# Patient Record
Sex: Male | Born: 1980 | Race: White | Hispanic: No | Smoking: Never smoker
Health system: Southern US, Community
[De-identification: ages and names within clinical notes are randomized; demographics above are authoritative.]

---

## 2020-01-31 ENCOUNTER — Emergency Department (HOSPITAL_BASED_OUTPATIENT_CLINIC_OR_DEPARTMENT_OTHER): Payer: Self-pay

## 2020-01-31 ENCOUNTER — Emergency Department (HOSPITAL_BASED_OUTPATIENT_CLINIC_OR_DEPARTMENT_OTHER)
Admission: EM | Admit: 2020-01-31 | Discharge: 2020-01-31 | Disposition: A | Discharge status: Home / Self Care | Payer: Self-pay | Attending: Emergency Medicine | Admitting: Emergency Medicine

## 2020-01-31 ENCOUNTER — Encounter (HOSPITAL_BASED_OUTPATIENT_CLINIC_OR_DEPARTMENT_OTHER): Payer: Self-pay

## 2020-01-31 ENCOUNTER — Other Ambulatory Visit: Payer: Self-pay

## 2020-01-31 DIAGNOSIS — M5413 Radiculopathy, cervicothoracic region: Secondary | ICD-10-CM | POA: Insufficient documentation

## 2020-01-31 DIAGNOSIS — M25512 Pain in left shoulder: Secondary | ICD-10-CM | POA: Insufficient documentation

## 2020-01-31 LAB — CBC WITH DIFFERENTIAL/PLATELET
Abs Immature Granulocytes: 0.03 10*3/uL (ref 0.00–0.07)
Basophils Absolute: 0.1 10*3/uL (ref 0.0–0.1)
Basophils Relative: 1 %
Eosinophils Absolute: 0.2 10*3/uL (ref 0.0–0.5)
Eosinophils Relative: 2 %
HCT: 47.6 % (ref 39.0–52.0)
Hemoglobin: 16.2 g/dL (ref 13.0–17.0)
Immature Granulocytes: 0 %
Lymphocytes Relative: 29 %
Lymphs Abs: 2.8 10*3/uL (ref 0.7–4.0)
MCH: 31.4 pg (ref 26.0–34.0)
MCHC: 34 g/dL (ref 30.0–36.0)
MCV: 92.2 fL (ref 80.0–100.0)
Monocytes Absolute: 0.9 10*3/uL (ref 0.1–1.0)
Monocytes Relative: 9 %
Neutro Abs: 5.9 10*3/uL (ref 1.7–7.7)
Neutrophils Relative %: 59 %
Platelets: 254 10*3/uL (ref 150–400)
RBC: 5.16 MIL/uL (ref 4.22–5.81)
RDW: 12.6 % (ref 11.5–15.5)
WBC: 10 10*3/uL (ref 4.0–10.5)
nRBC: 0 % (ref 0.0–0.2)

## 2020-01-31 LAB — BASIC METABOLIC PANEL
Anion gap: 11 (ref 5–15)
BUN: 16 mg/dL (ref 6–20)
CO2: 23 mmol/L (ref 22–32)
Calcium: 9.1 mg/dL (ref 8.9–10.3)
Chloride: 105 mmol/L (ref 98–111)
Creatinine, Ser: 0.96 mg/dL (ref 0.61–1.24)
GFR calc Af Amer: >60 mL/min (ref >60)
GFR calc non Af Amer: >60 mL/min (ref >60)
Glucose, Bld: 101 mg/dL — ABNORMAL HIGH (ref 70–99)
Potassium: 3.8 mmol/L (ref 3.5–5.1)
Sodium: 139 mmol/L (ref 135–145)

## 2020-01-31 LAB — TROPONIN I (HIGH SENSITIVITY): Troponin I (High Sensitivity): <2 ng/L (ref <18)

## 2020-01-31 MED ORDER — PREDNISONE 10 MG PO TABS: 40.0000 mg | ORAL_TABLET | Freq: Every day | ORAL | 0 refills | Status: AC

## 2020-01-31 NOTE — ED Provider Notes (Signed)
[facility] EMERGENCY DEPARTMENT Provider Note   CSN: [id number] Arrival date & time: 01/31/20  1949     History Chief Complaint  Patient presents with  . Shoulder Pain    Fred Carlson is a 39 y.o. male who presents to the ED today complaining of gradual onset, constant, worsening, left scapular pain that began 2 days ago.  She reports he woke up with pain.  He assumed he had slept wrong and went through his day without thinking of it anymore.  He states that today he noticed worsening pain as well as a tingling sensation to his left hand that has been coming and going.  Patient has not taken anything for the pain.  When asked if he is having chest pain he states "not really."  She does endorse significant family history for cardiac disease, reports his father had to have stenting placed at the age of 70.  He is unsure how old his paternal uncle was but states he thinks he may have been young as well.  Patient used to smoke occasionally as a teenager however none currently. Denies neck pain, numbness/weakness, shortness of breath, nausea, vomiting, or any other associated symptoms.   The history is provided by the patient and medical records.    HPI: A 39 year old patient with a history of obesity presents for evaluation of chest pain. Initial onset of pain was more than 6 hours ago. The patient's chest pain is not worse with exertion. The patient's chest pain is not middle- or left-sided, is not well-localized, is not described as heaviness/pressure/tightness, is not sharp and does radiate to the arms/jaw/neck. The patient does not complain of nausea and denies diaphoresis. The patient has a family history of coronary artery disease in a first-degree relative with onset less than age 54. The patient has no history of stroke, has no history of peripheral artery disease, has not smoked in the past 90 days, denies any history of treated diabetes, is not hypertensive and has no history of  hypercholesterolemia.   History reviewed. No pertinent past medical history.  There are no problems to display for this patient.   History reviewed. No pertinent surgical history.     No family history on file.  Social History   Tobacco Use  . Smoking status: Never Smoker  . Smokeless tobacco: Never Used  Substance Use Topics  . Alcohol use: Never  . Drug use: Never    Home Medications Prior to Admission medications   Medication Sig Start Date End Date Taking? Authorizing Provider  predniSONE (DELTASONE) 10 MG tablet Take 4 tablets (40 mg total) by mouth daily for 5 days. 01/31/20 02/05/20  Tanda Rockers, PA-C    Allergies    Bee venom and Levofloxacin  Review of Systems   Review of Systems  Constitutional: Negative for chills and fever.  Respiratory: Negative for shortness of breath.   Cardiovascular: Negative for chest pain.  Gastrointestinal: Negative for abdominal pain, nausea and vomiting.  Musculoskeletal: Positive for arthralgias.  Neurological: Negative for weakness and numbness.       + tingling to left hand  All other systems reviewed and are negative.   Physical Exam Updated Vital Signs BP (!) 148/99 (BP Location: Left Arm)   Pulse 78   Temp 98 F (36.7 C) (Oral)   Resp 16   Ht 6' (1.829 m)   Wt 104.3 kg   SpO2 98%   BMI 31.19 kg/m   Physical Exam Vitals and nursing note  reviewed.  Constitutional:      Appearance: He is obese. He is not ill-appearing or diaphoretic.  HENT:     Head: Normocephalic and atraumatic.  Eyes:     Conjunctiva/sclera: Conjunctivae normal.  Cardiovascular:     Rate and Rhythm: Normal rate and regular rhythm.     Pulses: Normal pulses.  Pulmonary:     Effort: Pulmonary effort is normal.     Breath sounds: Normal breath sounds. No wheezing, rhonchi or rales.  Abdominal:     Palpations: Abdomen is soft.     Tenderness: There is no abdominal tenderness. There is no guarding or rebound.  Musculoskeletal:      Cervical back: Neck supple.     Comments: No C, T, or L midline spinal tenderness. + Tenderness below left scapula. ROM intact to LUE. Strength and sensation intact throughout. 2+ radial pulse.   Skin:    General: Skin is warm and dry.  Neurological:     Mental Status: He is alert.     ED Results / Procedures / Treatments   Labs (all labs ordered are listed, but only abnormal results are displayed) Labs Reviewed  BASIC METABOLIC PANEL - Abnormal; Notable for the following components:      Result Value   Glucose, Bld 101 (*)    All other components within normal limits  CBC WITH DIFFERENTIAL/PLATELET  URINALYSIS, ROUTINE W REFLEX MICROSCOPIC  TROPONIN I (HIGH SENSITIVITY)  TROPONIN I (HIGH SENSITIVITY)    EKG EKG Interpretation  Date/Time:  Tuesday January 31 2020 20:08:45 EDT Ventricular Rate:  79 PR Interval:    QRS Duration: 84 QT Interval:  353 QTC Calculation: 405 R Axis:   69 Text Interpretation: Sinus rhythm Confirmed by Fredia Sorrow [phone]) on 01/31/2020 8:13:33 PM   Radiology DG Chest 2 View  Result Date: 01/31/2020 CLINICAL DATA:  39 year old male with shoulder pain. EXAM: CHEST - 2 VIEW COMPARISON:  None. FINDINGS: The lungs are clear. There is no pleural effusion or pneumothorax. The cardiac silhouette is within normal limits. No acute osseous pathology. IMPRESSION: No active cardiopulmonary disease. Electronically Signed   By: Anner Crete M.D.   On: 01/31/2020 20:53    Procedures Procedures (including critical care time)  Medications Ordered in ED Medications - No data to display  ED Course  I have reviewed the triage vital signs and the nursing notes.  Pertinent labs & imaging results that were available during my care of the patient were reviewed by me and considered in my medical decision making (see chart for details).  39 year old male who presents the ED today complaining of left scapular pain and paresthesias to his left hand x2 days.   Arrival to the ED patient is afebrile, nontachycardic and nontachypneic.  He appears to be in no acute distress.  Nondiaphoretic.  Asking if patient has chest pain he states "not really" however is not very straightforward with his answer. He does note significant FHx for cardiac disease. EKG without ischemic changes. Pt without any obvious left shoulder or cervical pain to account for paresthesias in hand. Negative spurling's test. Will work up from a cardiac stand point at this time given history. If no acute findings will treat for MSK type pain. PERC negative at this time. Pt has equal pulses bilaterally and blood pressure stable; doubt dissection.   CBC without leukocytosis.  Hemoglobin stable. BMP with glucose 101.  No other electrolyte abnormalities.  Troponin less than 2. CXR clear.   Given pt's  pain has been ongoing for 2 days do not feel he needs a repeat trop. Will treat with prednisone with questionable pinched nerve causing tingling. Pt advised to take tylenol with prednisone as well. Strict return precautions discussed with pt. He is in agreement with plan and stable for discharge home.   Discussed case with attending physician Dr. Deretha Emory who agrees with plan   This note was prepared using Dragon voice recognition software and may include unintentional dictation errors due to the inherent limitations of voice recognition software.    MDM Rules/Calculators/A&P HEAR Score: 1                     Final Clinical Impression(s) / ED Diagnoses Final diagnoses:  Acute pain of left shoulder  Radiculopathy of cervicothoracic region    Rx / DC Orders ED Discharge Orders         Ordered    predniSONE (DELTASONE) 10 MG tablet  Daily     01/31/20 2204           Discharge Instructions     Please pick up medication and take as prescribed. You can take Tylenol with the prednisone to help with pain. I would not recommend NSAIDs as it can increase risk of bleeding in your stomach.     Return to the ED for any worsening symptoms including worsening pain, worsening tingling, chest pain, shortness of breath, passing out.        Tanda Rockers, PA-C 01/31/20 2206    Vanetta Mulders, MD 02/03/20 [phone]

## 2020-01-31 NOTE — Discharge Instructions (Addendum)
Please pick up medication and take as prescribed. You can take Tylenol with the prednisone to help with pain. I would not recommend NSAIDs as it can increase risk of bleeding in your stomach.   Return to the ED for any worsening symptoms including worsening pain, worsening tingling, chest pain, shortness of breath, passing out.

## 2020-01-31 NOTE — ED Triage Notes (Addendum)
Pt c/o numbness to left UE started last night and "knot in my left shoulder" x 2 days-denies injury-pt points to left mid back/scapula area for pain site-NAD-steady gait

## 2020-01-31 NOTE — ED Notes (Signed)
Pt reports that during x-ray the positions he had to hold his LUE caused significant pain however states that since returning to room this is the longest he has been able to stay comfortably in the same position.  EDP made aware. Pt continues to deny any radiation of pain from scapula to chest.

## 2020-01-31 NOTE — ED Notes (Signed)
Pt aware of need for urine specimen. Urinal at bedside

## 2020-10-25 IMAGING — DX DG CHEST 2V
2 series · 2 of 2 positions shown · non-contrast
Comparison: None.

CLINICAL DATA: 38-year-old male with shoulder pain.

EXAM:
CHEST - 2 VIEW

[chest pa]
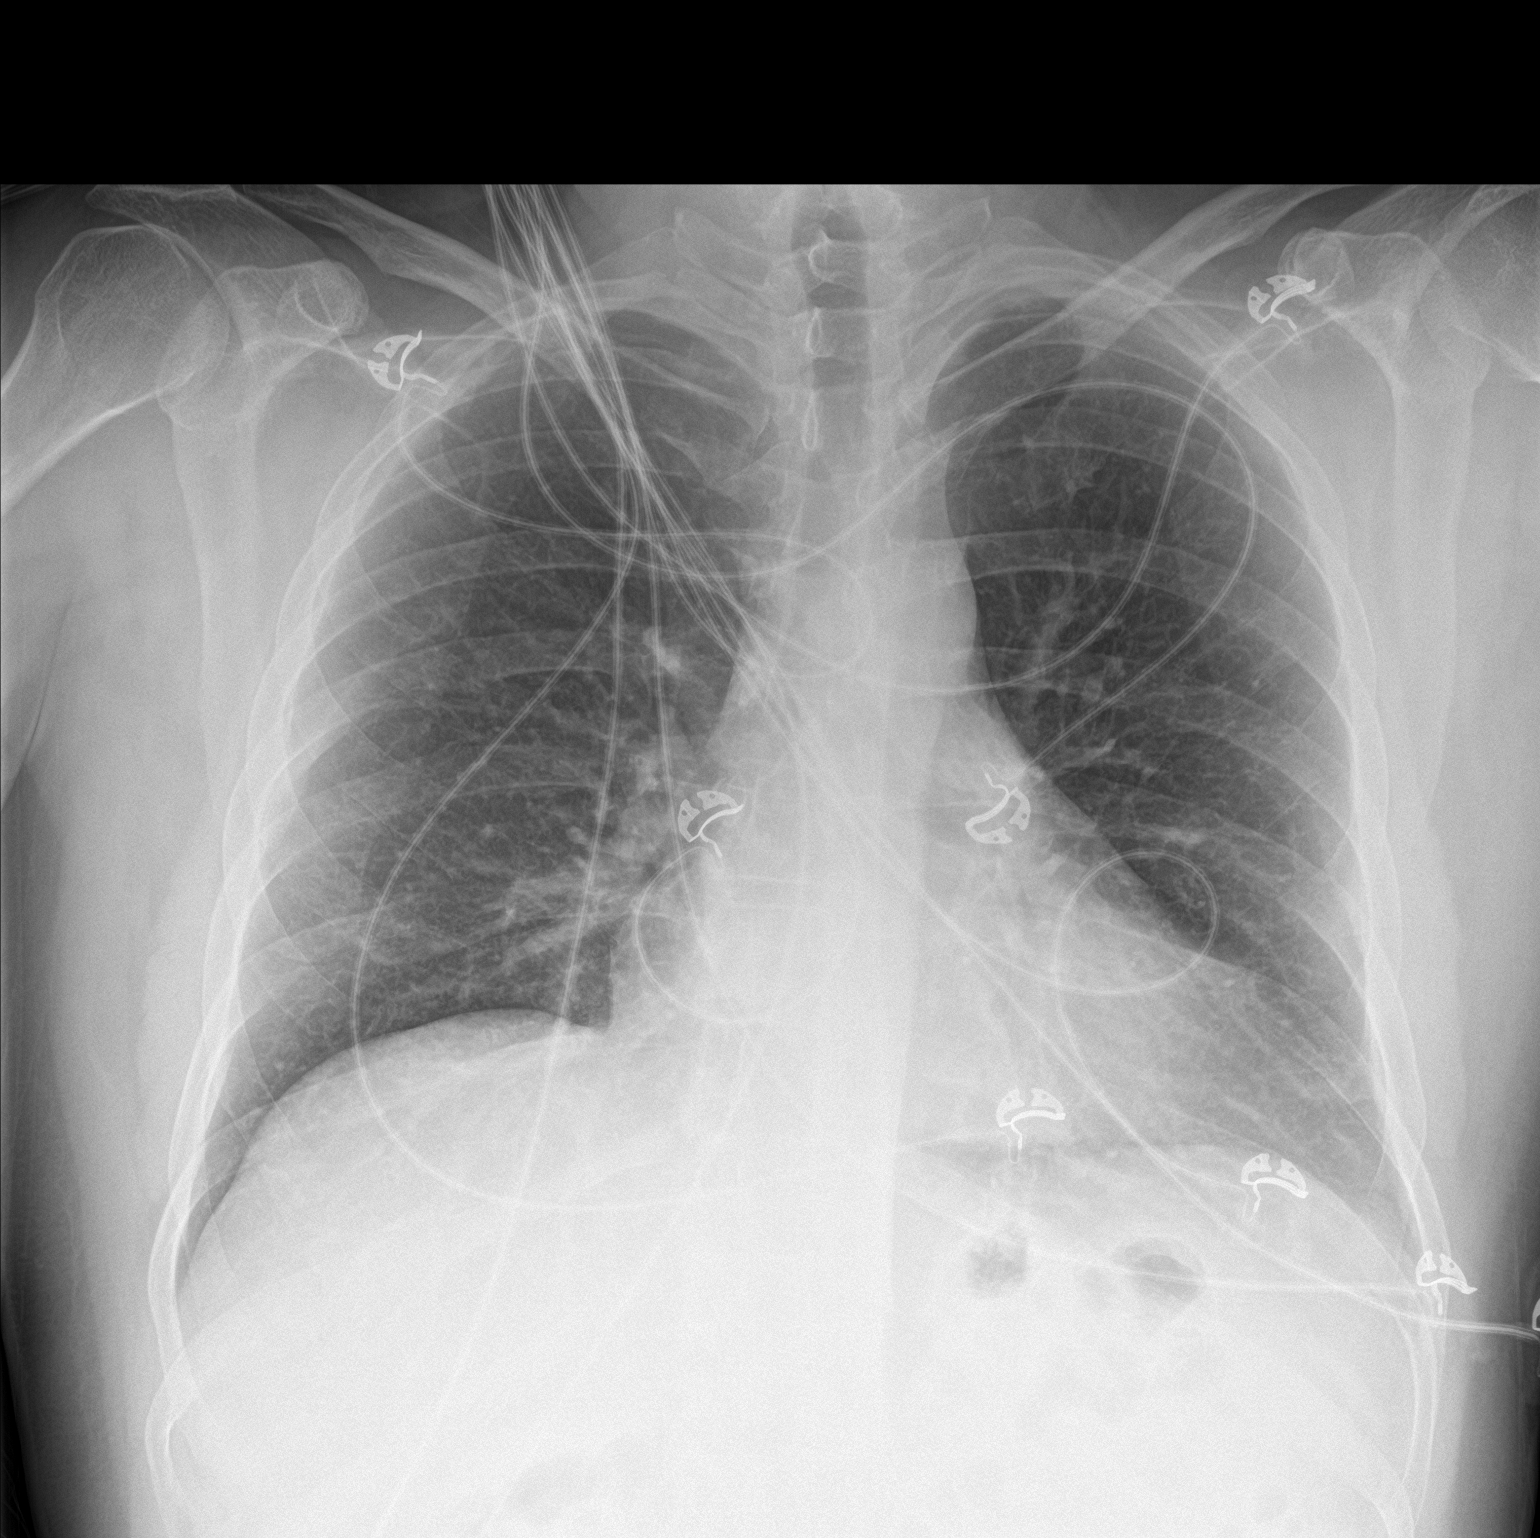

[chest lat]
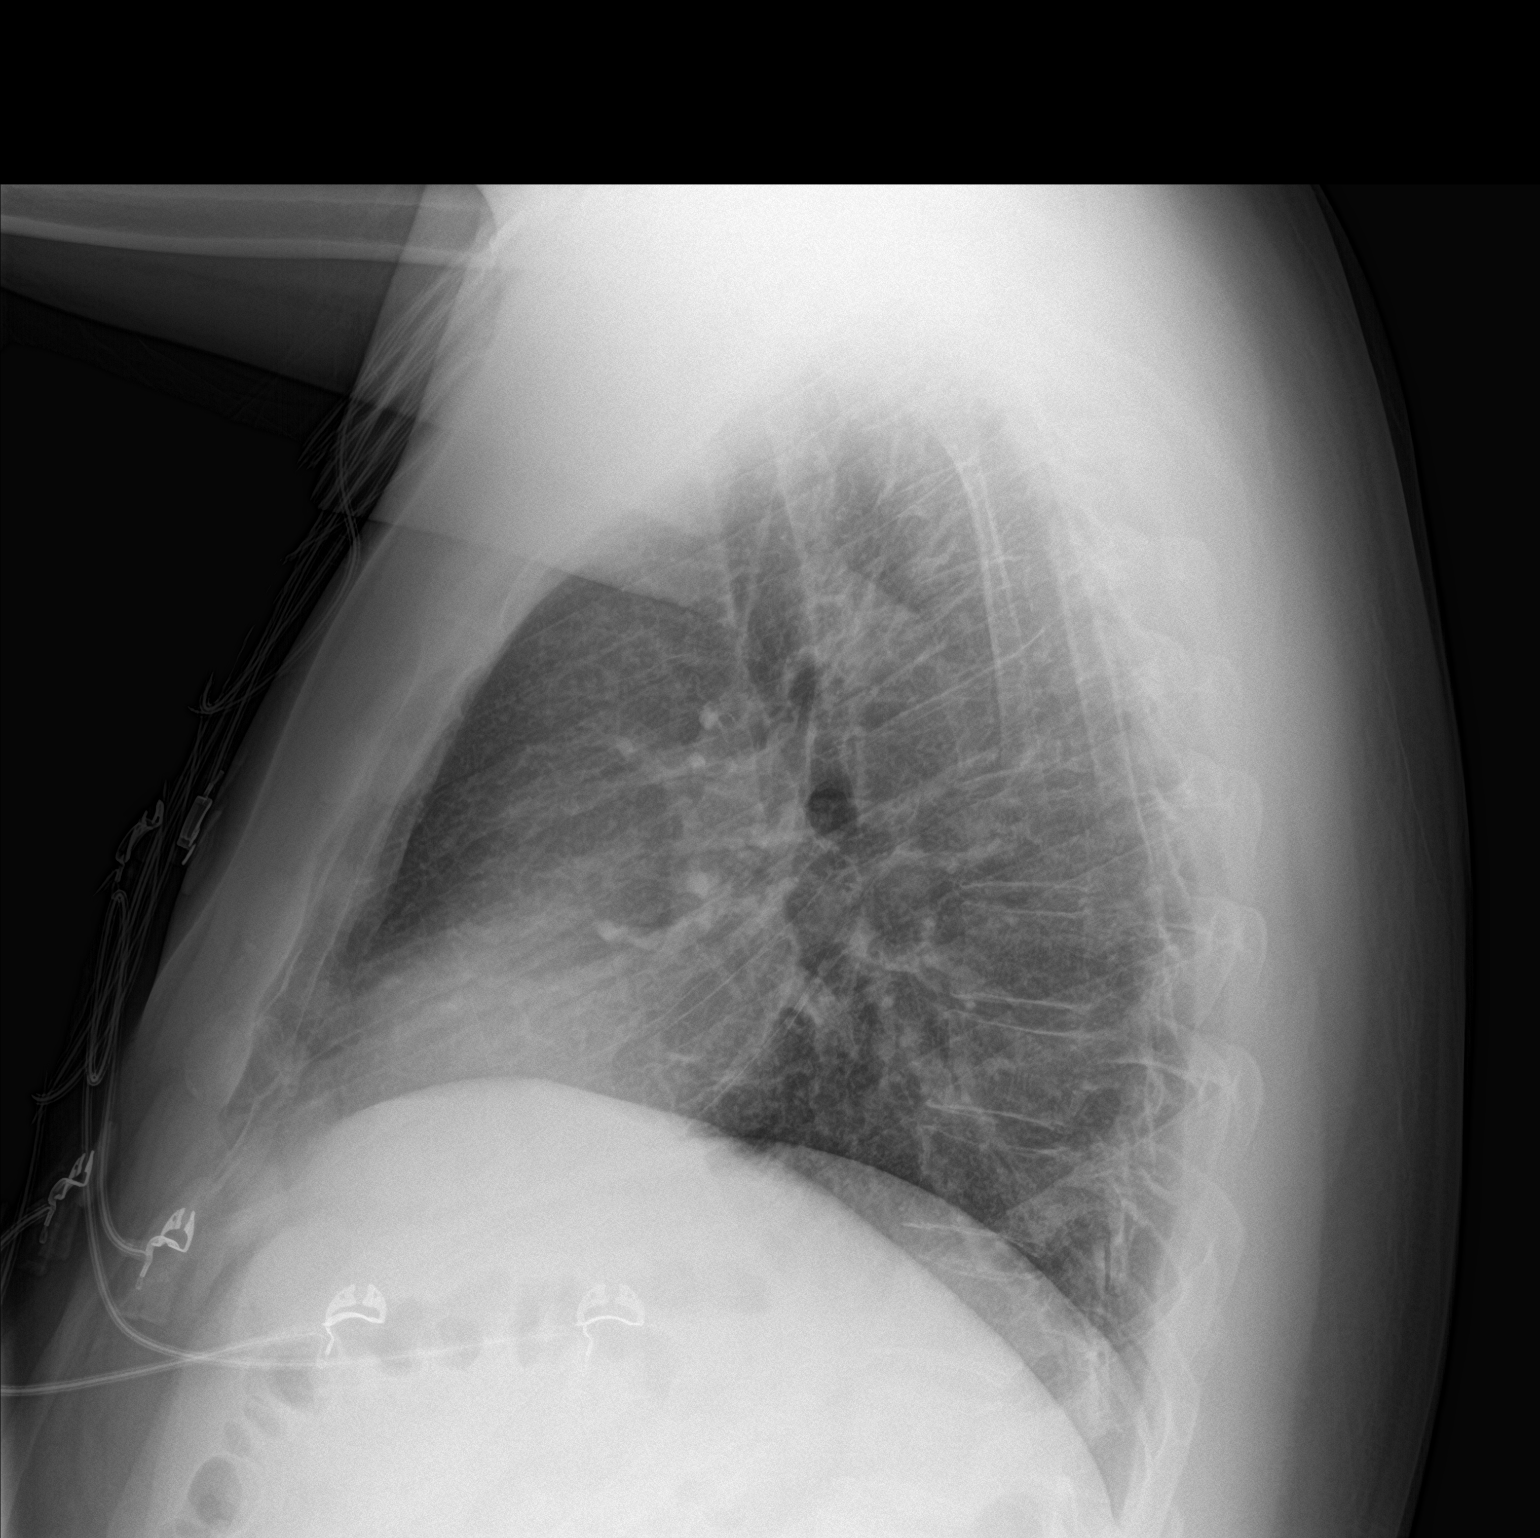

[2 of 2 positions shown; findings below may reference images not displayed]

FINDINGS: The lungs are clear. There is no pleural effusion or pneumothorax.
The cardiac silhouette is within normal limits. No acute osseous
pathology.
IMPRESSION: No active cardiopulmonary disease.
